# Patient Record
Sex: Male | Born: 2000 | Race: Black or African American | Hispanic: No | Marital: Single | Smoking: Never smoker
Health system: Southern US, Community
[De-identification: ages and names within clinical notes are randomized; demographics above are authoritative.]

## PROBLEM LIST (undated history)

## (undated) DIAGNOSIS — J45909 Unspecified asthma, uncomplicated: Secondary | ICD-10-CM

## (undated) DIAGNOSIS — T7840XA Allergy, unspecified, initial encounter: Secondary | ICD-10-CM

## (undated) HISTORY — PX: FRACTURE SURGERY: SHX138

## (undated) HISTORY — PX: INGUINAL HERNIA REPAIR: SUR1180

---
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
## 2022-07-20 IMAGING — CR DG ABDOMEN 1V
1 series · 2 of 2 positions shown · non-contrast
Comparison: None Available.

CLINICAL DATA: Constipation

EXAM:
ABDOMEN - 1 VIEW

[Series 1: dg abd 1 view · 0.14mm/px · 2 of 2 slices shown]
[im 1/2]
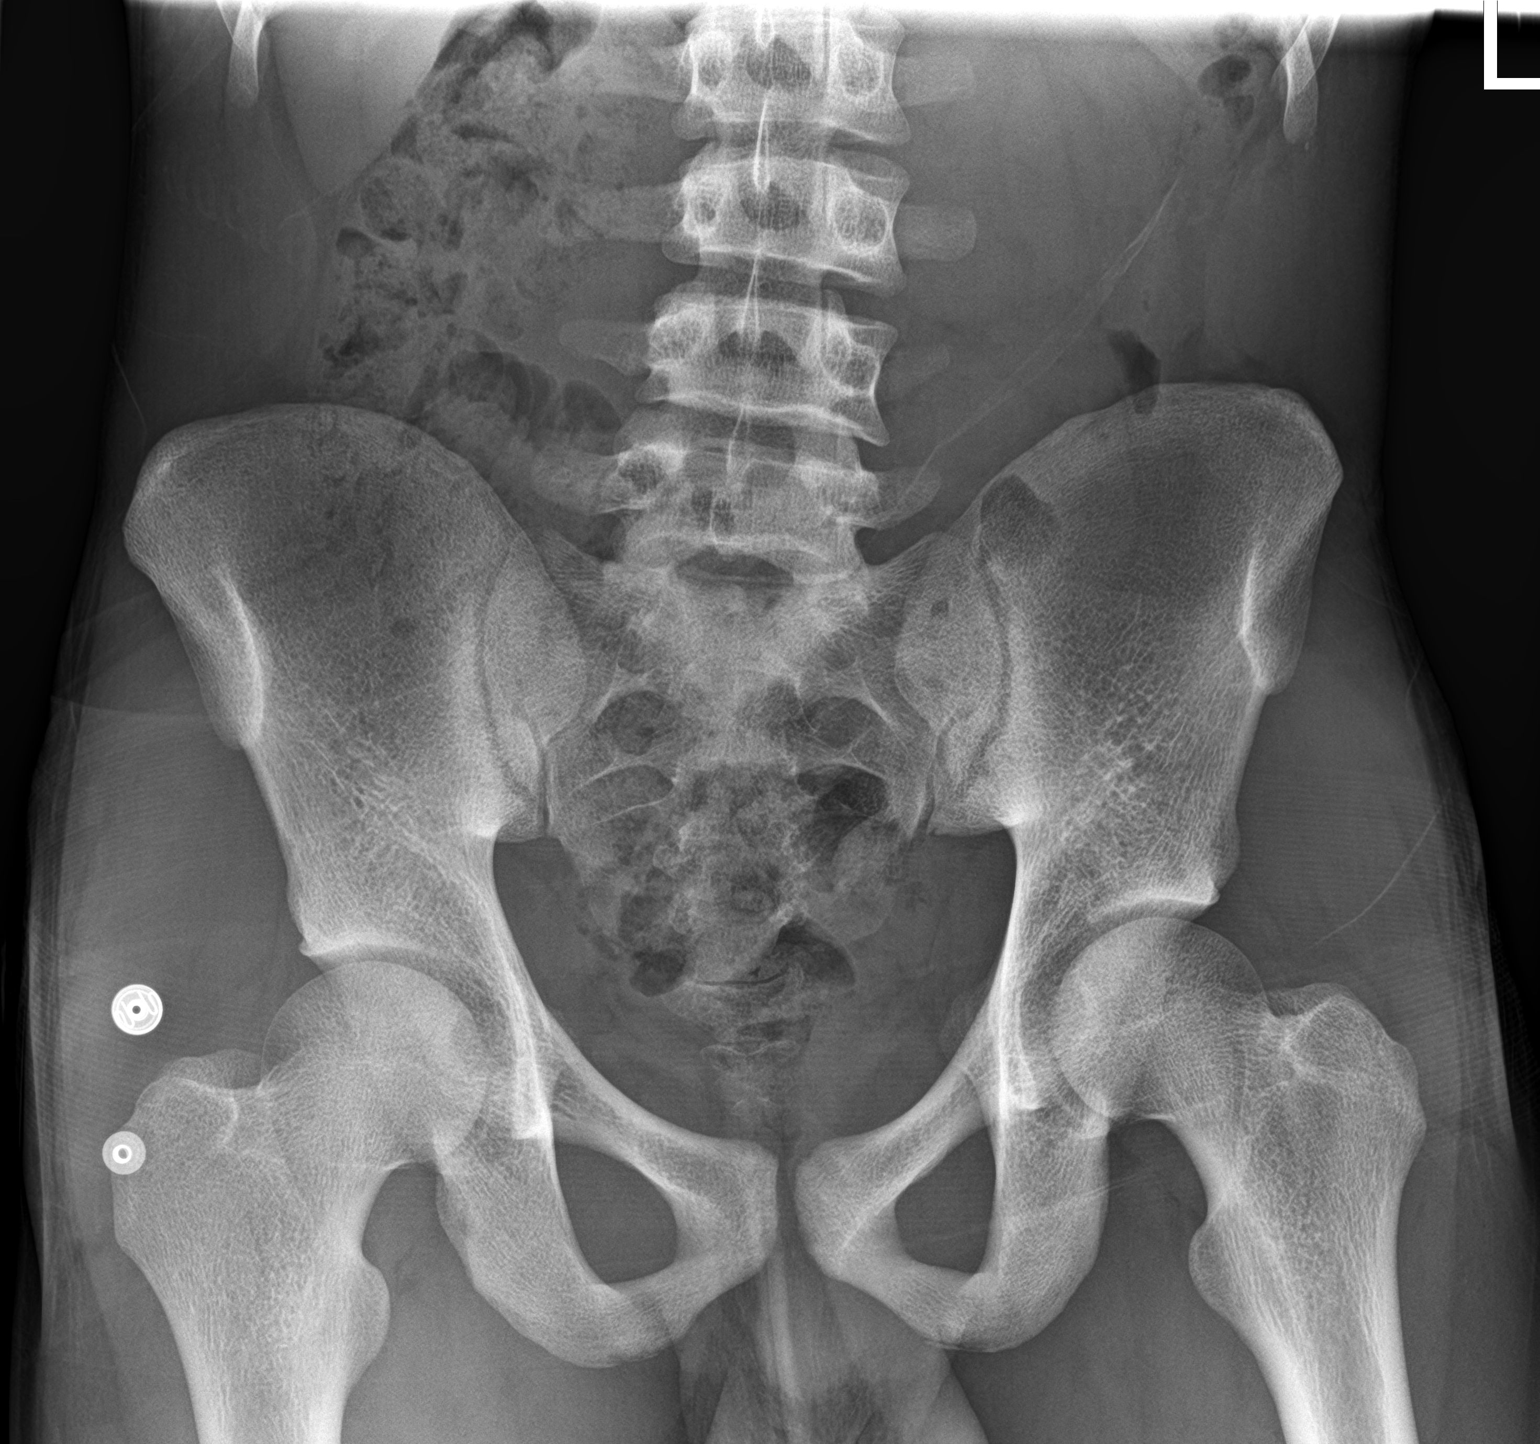
[im 2/2]
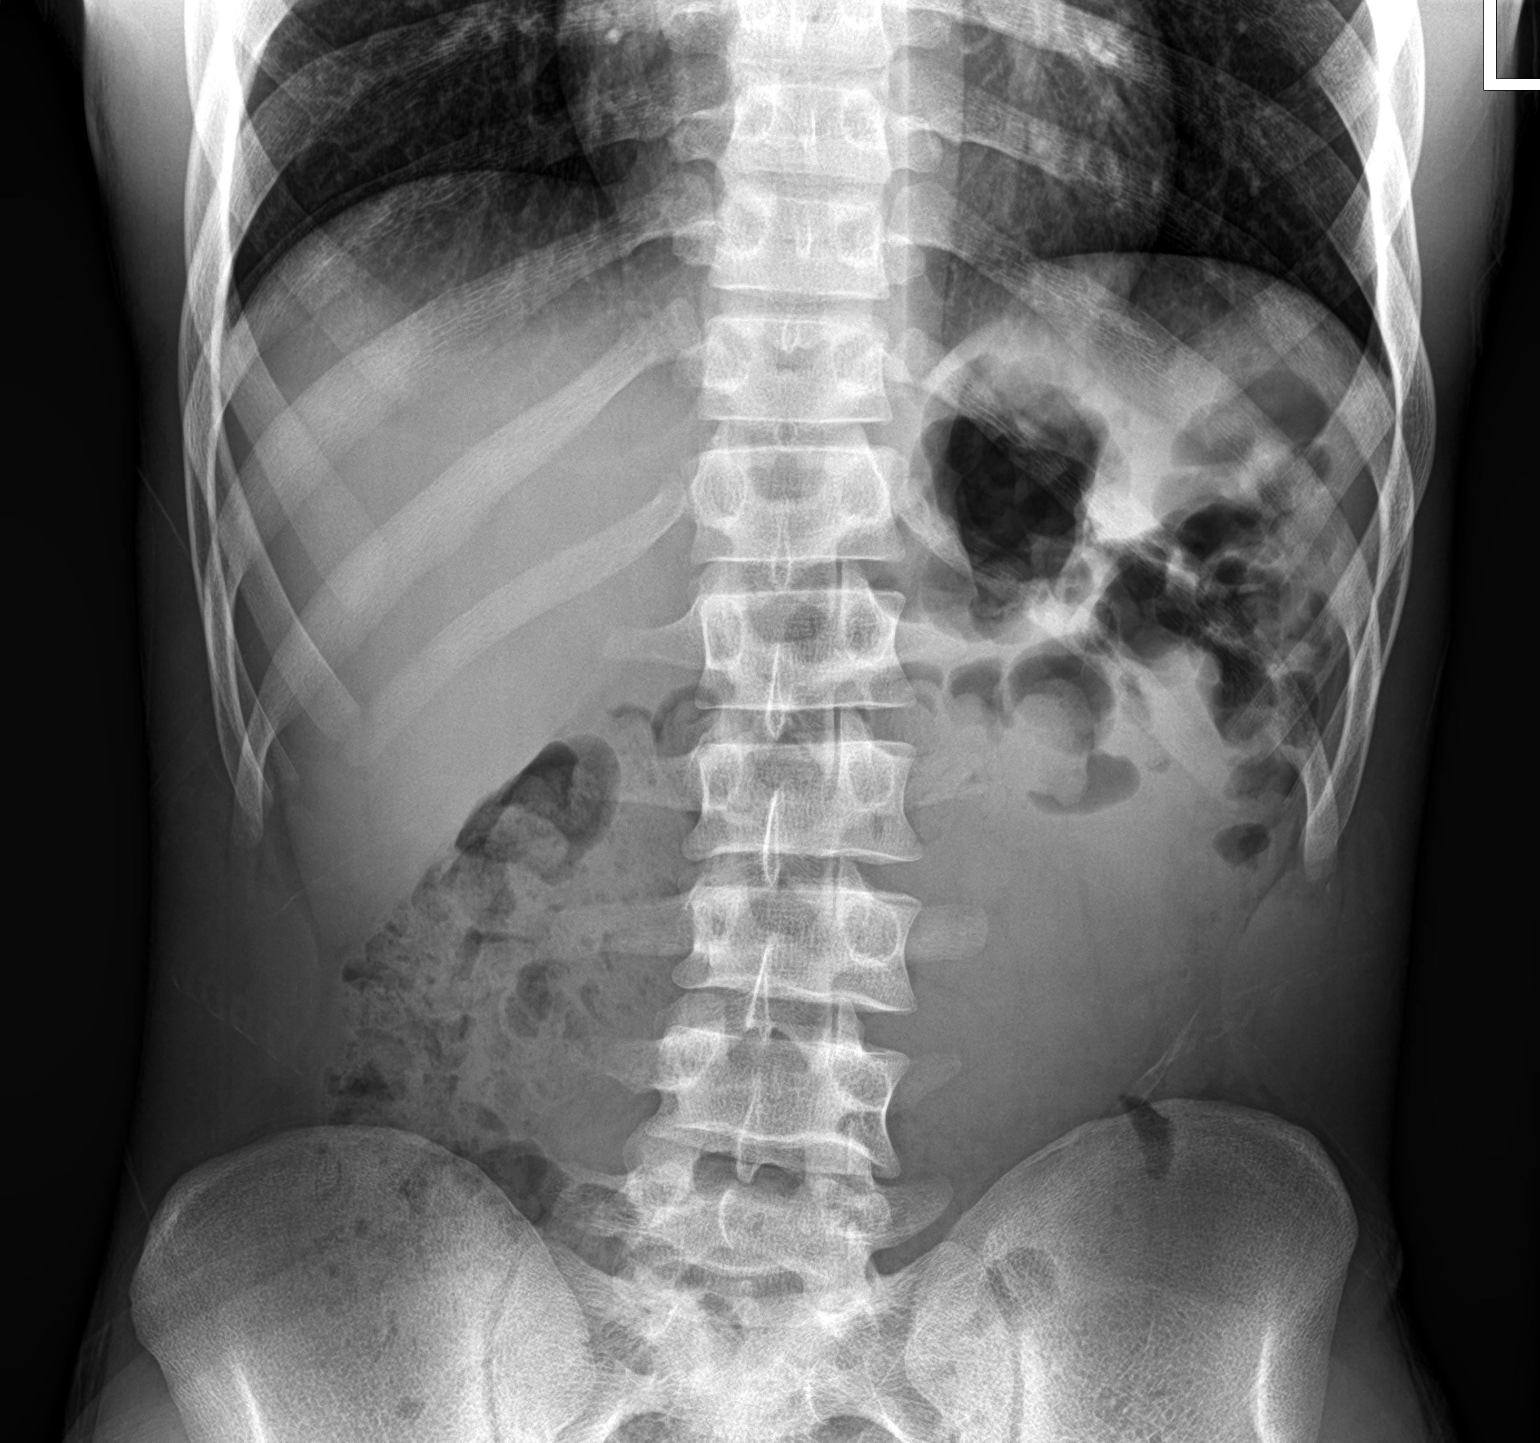

[2 of 2 positions shown; findings below may reference images not displayed]

FINDINGS: The bowel gas pattern is normal. Moderate volume of stool within the
colon. No radio-opaque calculi or other significant radiographic
abnormality are seen. No acute bony findings.
IMPRESSION: 1. Nonobstructive bowel gas pattern.
2. Moderate volume of stool within the colon.
